# Patient Record
Sex: Male | Born: 1965 | Race: White | Hispanic: No
Health system: Midwestern US, Academic
[De-identification: ages and names within clinical notes are randomized; demographics above are authoritative.]

---

## 2020-08-18 ENCOUNTER — Encounter: Admit: 2020-08-18 | Discharge: 2020-08-18 | Payer: Commercial Managed Care - PPO

## 2020-08-19 ENCOUNTER — Encounter: Admit: 2020-08-19 | Discharge: 2020-08-19 | Payer: Commercial Managed Care - PPO

## 2020-08-25 ENCOUNTER — Encounter: Admit: 2020-08-25 | Discharge: 2020-08-25 | Payer: Commercial Managed Care - PPO

## 2020-10-06 ENCOUNTER — Encounter: Admit: 2020-10-06 | Discharge: 2020-10-06 | Payer: Commercial Managed Care - PPO

## 2020-10-09 ENCOUNTER — Encounter: Admit: 2020-10-09 | Discharge: 2020-10-09 | Payer: Commercial Managed Care - PPO

## 2020-10-09 DIAGNOSIS — R918 Other nonspecific abnormal finding of lung field: Secondary | ICD-10-CM

## 2020-10-10 NOTE — Telephone Encounter
Patient will need to be scheduled to see Gen Pulm in 2-4 weeks with repeat CT Chest w/o contrast. Order entered. Email sent to Marguarite Arbour with scheduling request.  Einar Crow, RN     Referred:  10/29 self referral  Reason:  Lung nodules  Imaging:  CT Chest 03/14/20  Pulmonologist:  Riki Sheer. MD

## 2020-10-20 ENCOUNTER — Encounter: Admit: 2020-10-20 | Discharge: 2020-10-20 | Payer: Commercial Managed Care - PPO

## 2021-03-10 ENCOUNTER — Encounter: Admit: 2021-03-10 | Discharge: 2021-03-10 | Payer: Private Health Insurance - Indemnity

## 2021-03-10 NOTE — Telephone Encounter
Pt is requesting to schedule an appt with Dr. Hardie Lora. LVM [id number]

## 2022-07-09 ENCOUNTER — Encounter: Admit: 2022-07-09 | Discharge: 2022-07-09 | Payer: Private Health Insurance - Indemnity

## 2022-12-26 IMAGING — CR [ID]
3 series · 3 of 3 positions shown · non-contrast
Comparison: none

[foot ap]
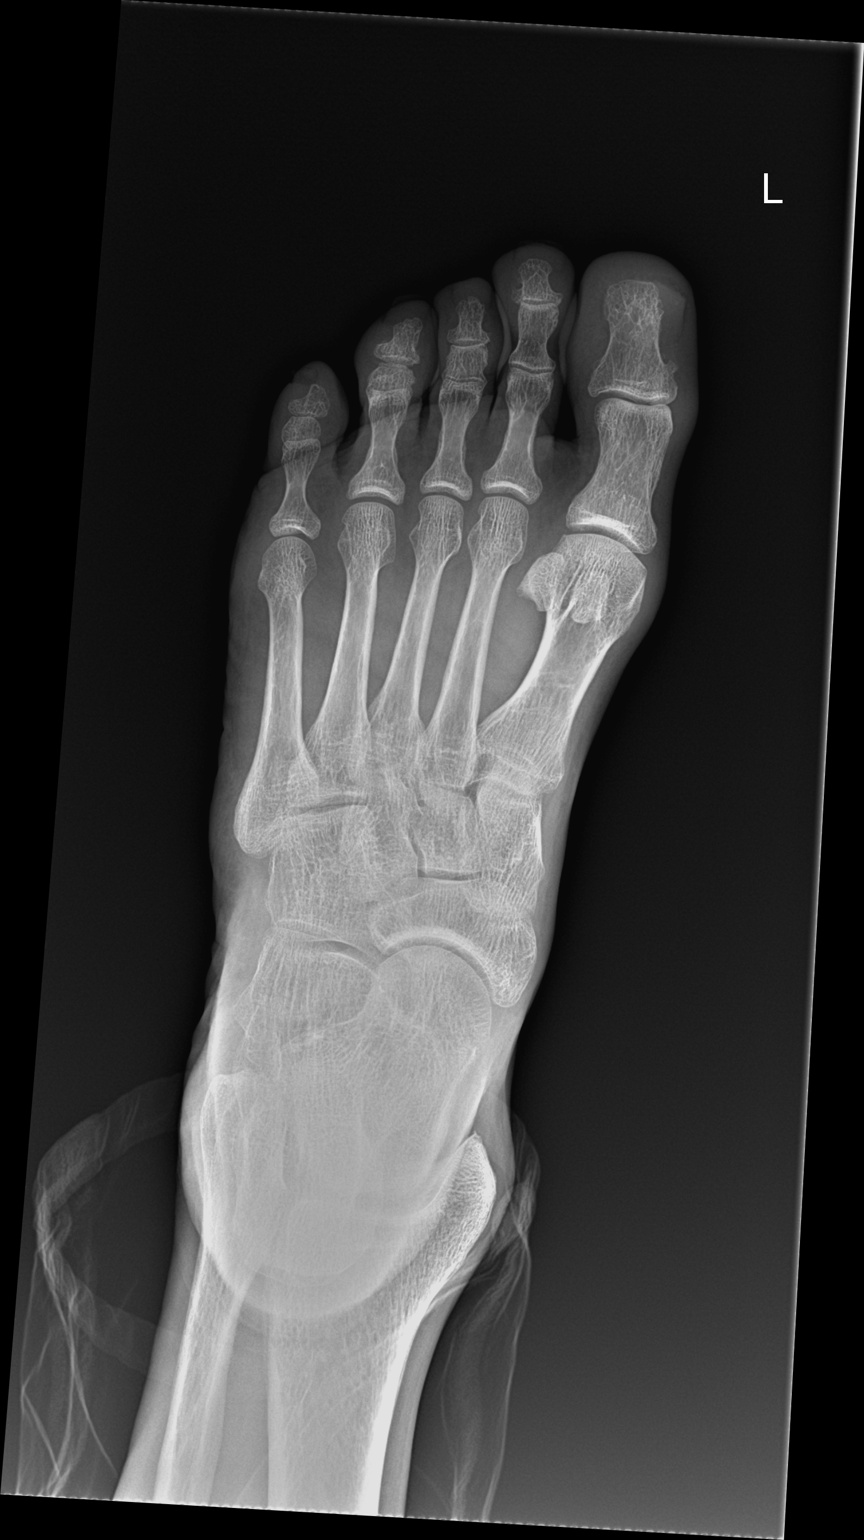

[foot obl]
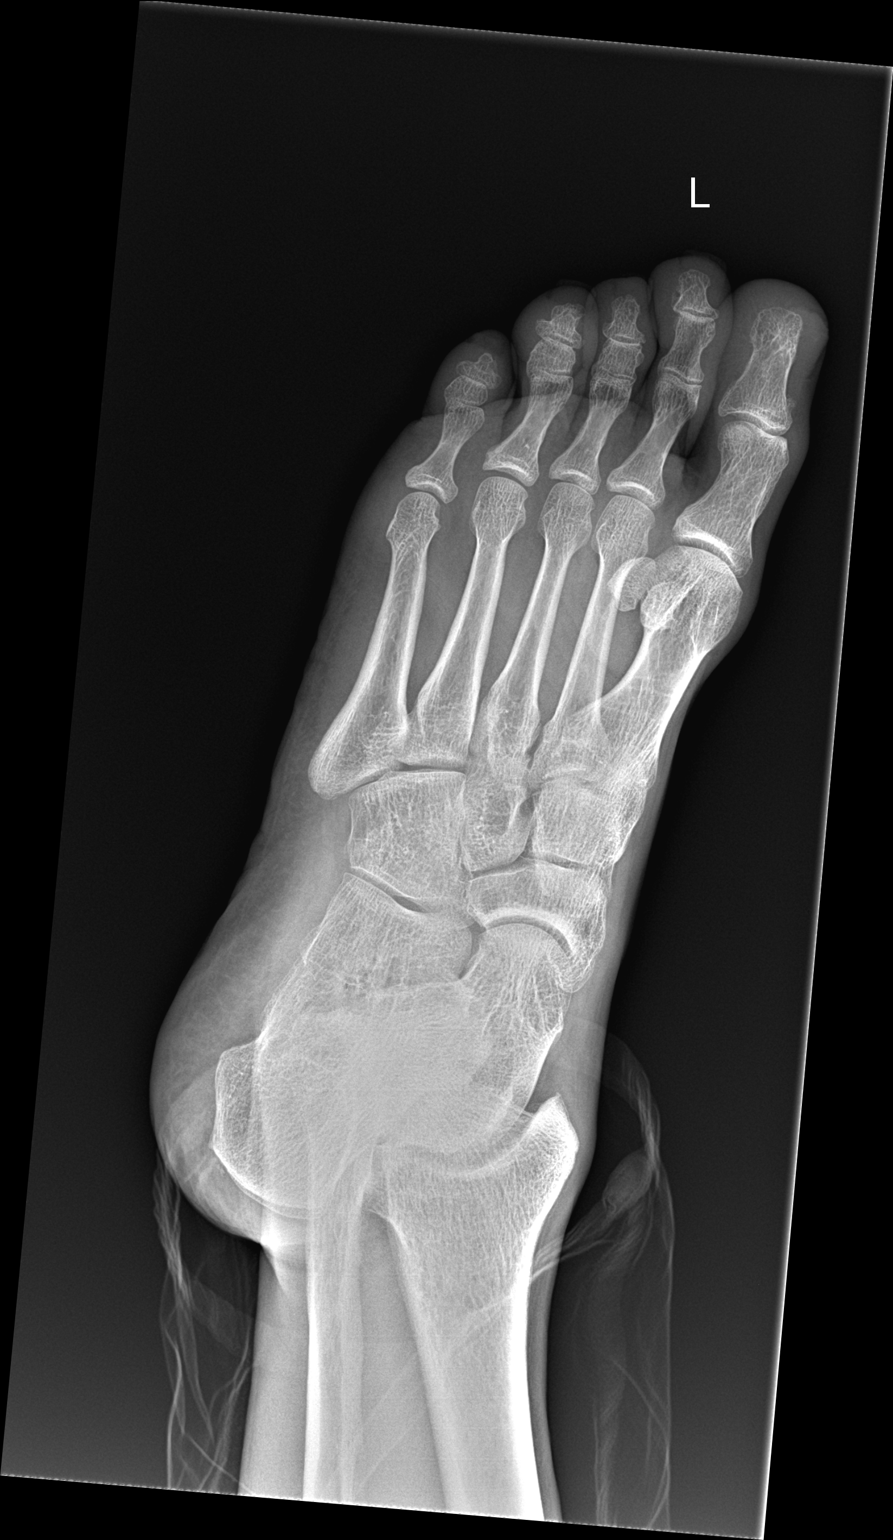

[foot lat]
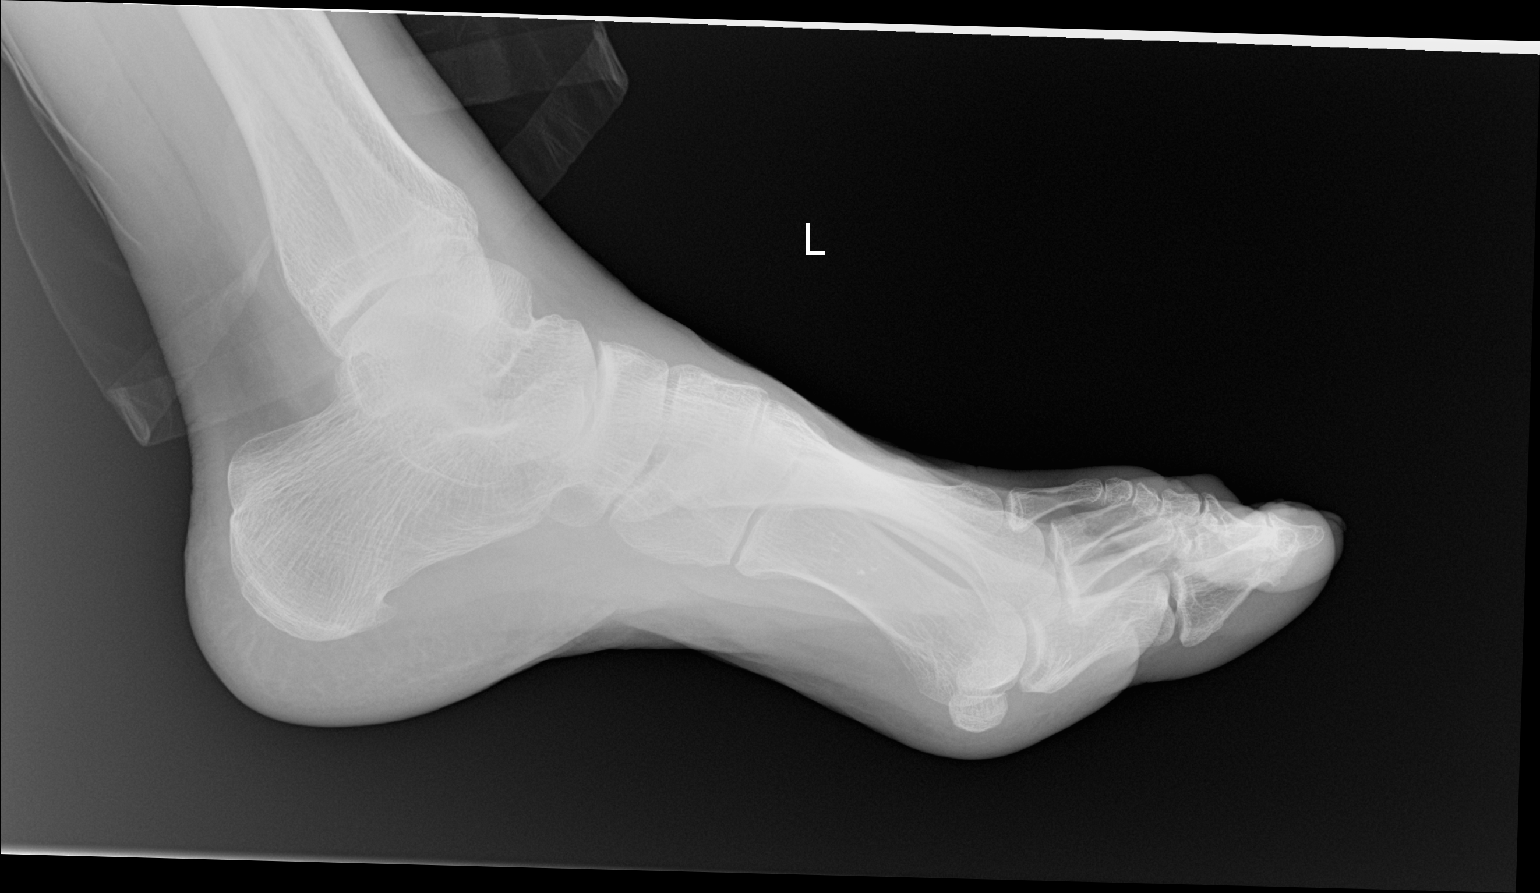

[3 of 3 positions shown; findings below may reference images not displayed]

EXAM

XR foot LT min 3V

INDICATION

left foot foreign body
stepped on glass 2 months ago, pain still on lateral side. pt states doesnt know if he got all the
glass out

TECHNIQUE

Three views of the left foot

COMPARISONS

None available at the time of dictation.

FINDINGS

No radiographic evidence of an acute fracture, osseous malalignment, or aggressive focal osseous
lesion. There is anatomic joint alignment. Trace Achilles insertional enthesophyte.

IMPRESSION
1. No radiographic evidence of an acute osseous abnormality or significant degenerative change.

Tech Notes:

stepped on glass 2 months ago, pain still on lateral side. pt states doesnt know if he got all the
glass [DATE]

## 2023-06-04 ENCOUNTER — Encounter: Admit: 2023-06-04 | Discharge: 2023-06-04 | Payer: No Typology Code available for payment source

## 2023-06-10 ENCOUNTER — Encounter: Admit: 2023-06-10 | Discharge: 2023-06-10 | Payer: No Typology Code available for payment source

## 2023-06-11 NOTE — Patient Instructions
Routine Clinic Information:    Make Checking in faster by using MyChart ahead of time by using the pre check in and complete the questionnaires. This will make your next visit Check in faster.      Please don't hesitate to call if you have any problems or questions.   My nurse, Maquita Sandoval, RN, can be reached at [phone].  If she does not answer, please leave a voicemail as she is probably rooming other patients. Please leave your name, the spelling of your last name, date of birth, phone number they can call you back and a description of why you are calling. You may also message us in MyChart.    Our fax number is [phone].     Medication refills:  Please use the MyChart Refill request or contact your pharmacy directly to request medication refills.  Please allow at least 3 business days for refill requests  .  Lab work:  The main lab is on the 1st floor of [facility].  [address], [address]  [place], [place] [address]  LAB HOURS  Mon 7 a.m. - 6 p.m.  Tues 7 a.m. - 6 p.m.  Wed 7 a.m. - 6 p.m.  Thur 7 a.m. - 6 p.m.  Fri 7 a.m. - 6 p.m.  Sat 7 a.m. - noon  Sun Closed -  It is walk in only, they do not take appointments:  Other lab locations are available; please see [email or web address]    Test results:  You will receive your test results at your appointment, by MyChart (our patient portal), or via phone or letter.   We prefer to use MyChart as much as possible.  If you are expecting results and have not heard from my office within 2 weeks of your testing, please send a MyChart message or call my office.    As a part of the CARES act, starting 03/10/2020, some results will be released to mychart automatically.  With these changes you may see your results before I do.  Critical lab results will be addressed immediately, but otherwise please  give me 72 hours to view and respond to your results before reaching out with questions. Radiology is on the 2nd floor of the [facility], among several other locations.  Please contact the radiology department to schedule at [phone].    Scheduling is available directly through MyChart, or via phone at [phone].  Same day and urgent care appointments are available.    We offer same day appointments for your acute health concerns. These appointments are on a first come, first serve basis. Please call [phone] if you would like to make an appointment.   Appointment reminders may be received over the phone and by text.  Communication preferences can be managed in MyChart to ensure you receive important appointment notifications.    Support for many chronic illnesses is available through [facility]: [facility] or 913-574-0900.    We offer Integrated Behavioral Health services, nutrition support, and pharmacist support services free of charge.  To schedule an appointment with a specialist and/or testing please call the central scheduling number at [phone].     You may see my nurse practitioner, Katelyn Wulff, for urgent needs or if I am unavailable.  We are working as a team to provide better continuity and access to our patients.      If you ever have emergency symptoms of chest pain, shortness of breath or uncontrolled or unexplained pain, please go to   your closest emergency room.  You can give us an update after you have addressed any emergency.        For urgent issues after business hours/weekends/holidays call [phone] and request for the outpatient internal medicine physician to be paged.

## 2023-06-12 ENCOUNTER — Encounter: Admit: 2023-06-12 | Discharge: 2023-06-12 | Payer: No Typology Code available for payment source

## 2023-06-12 ENCOUNTER — Ambulatory Visit: Admit: 2023-06-12 | Discharge: 2023-06-13 | Payer: No Typology Code available for payment source

## 2023-06-12 DIAGNOSIS — Z122 Encounter for screening for malignant neoplasm of respiratory organs: Secondary | ICD-10-CM

## 2023-06-12 DIAGNOSIS — Z23 Encounter for immunization: Secondary | ICD-10-CM

## 2023-06-12 DIAGNOSIS — Z7689 Persons encountering health services in other specified circumstances: Secondary | ICD-10-CM

## 2023-06-12 DIAGNOSIS — K4021 Bilateral inguinal hernia, without obstruction or gangrene, recurrent: Secondary | ICD-10-CM

## 2023-06-12 DIAGNOSIS — R918 Other nonspecific abnormal finding of lung field: Secondary | ICD-10-CM

## 2023-06-12 DIAGNOSIS — F419 Anxiety disorder, unspecified: Secondary | ICD-10-CM

## 2023-06-12 DIAGNOSIS — Z1159 Encounter for screening for other viral diseases: Secondary | ICD-10-CM

## 2023-06-12 DIAGNOSIS — Z720 Tobacco use: Secondary | ICD-10-CM

## 2023-06-12 DIAGNOSIS — I10 Essential (primary) hypertension: Secondary | ICD-10-CM

## 2023-06-12 DIAGNOSIS — T7840XA Allergy, unspecified, initial encounter: Secondary | ICD-10-CM

## 2023-06-12 DIAGNOSIS — Z1211 Encounter for screening for malignant neoplasm of colon: Secondary | ICD-10-CM

## 2023-06-12 MED ORDER — METOPROLOL TARTRATE 25 MG PO TAB
25 mg | ORAL_TABLET | Freq: Two times a day (BID) | ORAL | 1 refills | 90.00000 days | Status: AC
Start: 2023-06-12 — End: ?
  Filled 2023-06-12: qty 60, 30d supply, fill #1
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
## 2023-06-12 NOTE — Assessment & Plan Note
Elevated blood pressure noted during visit.  -Started Metoprolol 25 mg to be taken twice daily.  -Monitor blood pressure at follow-up.

## 2023-06-12 NOTE — Assessment & Plan Note
Stable for the past 3-4 years. Discussed the low likelihood of malignancy given stability over time.  -Ordered lung imaging to monitor nodules.

## 2023-06-12 NOTE — Assessment & Plan Note
Reports occasional marijuana use for anxiety management. Denies need for additional medication at this time.  -Monitor and reassess need for pharmacotherapy at follow-up.

## 2023-06-12 NOTE — Assessment & Plan Note
Reports bilateral inguinal hernias with ability to reduce. No signs of incarceration or strangulation.  -Ordered abdominal ultrasound to assess hernia severity.  -Referral to surgery for evaluation and potential repair placed.

## 2023-06-12 NOTE — Assessment & Plan Note
Current smoker with a pack lasting 3-4 days. Discussed the risks associated with smoking, including increased risk for lung cancer.  -Encourage smoking cessation.  -Consider nicotine replacement therapy or other smoking cessation aids.

## 2023-06-12 NOTE — Progress Notes
Answers submitted by the patient for this visit:  Other Symptom Questionnaire (Submitted on 06/12/2023)  Chief Complaint: Patient Reported Other  What topic(s) would you like to cover during your appointment?: lungs  Please describe the issue(s) and history with the issue (location, severity, duration, symptoms, etc.). : i have put this off to many years  What has been done so far to take care of the issue(s)?: well ihave been to a few doctors and a wast of time  What are your goals for this visit?: to take care of a few problems

## 2023-06-13 DIAGNOSIS — Z114 Encounter for screening for human immunodeficiency virus [HIV]: Secondary | ICD-10-CM

## 2023-06-13 DIAGNOSIS — Z125 Encounter for screening for malignant neoplasm of prostate: Secondary | ICD-10-CM

## 2023-06-13 DIAGNOSIS — Z0000 Encounter for general adult medical examination without abnormal findings: Secondary | ICD-10-CM

## 2023-06-13 DIAGNOSIS — Z13220 Encounter for screening for lipoid disorders: Secondary | ICD-10-CM

## 2023-06-13 DIAGNOSIS — Z131 Encounter for screening for diabetes mellitus: Secondary | ICD-10-CM

## 2023-06-17 ENCOUNTER — Encounter: Admit: 2023-06-17 | Discharge: 2023-06-17 | Payer: No Typology Code available for payment source

## 2023-06-22 ENCOUNTER — Encounter: Admit: 2023-06-22 | Discharge: 2023-06-22 | Payer: No Typology Code available for payment source

## 2023-06-26 ENCOUNTER — Encounter: Admit: 2023-06-26 | Discharge: 2023-06-26 | Payer: No Typology Code available for payment source

## 2023-06-27 ENCOUNTER — Encounter: Admit: 2023-06-27 | Discharge: 2023-06-27 | Payer: No Typology Code available for payment source

## 2023-06-27 ENCOUNTER — Ambulatory Visit: Admit: 2023-06-27 | Discharge: 2023-06-27 | Payer: No Typology Code available for payment source

## 2023-06-27 DIAGNOSIS — Z1211 Encounter for screening for malignant neoplasm of colon: Secondary | ICD-10-CM

## 2023-07-09 ENCOUNTER — Encounter: Admit: 2023-07-09 | Discharge: 2023-07-09 | Payer: No Typology Code available for payment source

## 2023-07-09 DIAGNOSIS — F419 Anxiety disorder, unspecified: Secondary | ICD-10-CM

## 2023-07-09 DIAGNOSIS — T7840XA Allergy, unspecified, initial encounter: Secondary | ICD-10-CM

## 2023-07-11 ENCOUNTER — Encounter: Admit: 2023-07-11 | Discharge: 2023-07-11 | Payer: No Typology Code available for payment source

## 2023-07-11 ENCOUNTER — Ambulatory Visit: Admit: 2023-07-11 | Discharge: 2023-07-11 | Payer: No Typology Code available for payment source

## 2023-07-17 ENCOUNTER — Encounter: Admit: 2023-07-17 | Discharge: 2023-07-17 | Payer: No Typology Code available for payment source

## 2023-07-18 ENCOUNTER — Encounter: Admit: 2023-07-18 | Discharge: 2023-07-18 | Payer: No Typology Code available for payment source

## 2023-07-22 ENCOUNTER — Encounter: Admit: 2023-07-22 | Discharge: 2023-07-22 | Payer: No Typology Code available for payment source

## 2024-02-26 ENCOUNTER — Encounter: Admit: 2024-02-26 | Discharge: 2024-02-26 | Payer: PRIVATE HEALTH INSURANCE

## 2024-04-16 ENCOUNTER — Encounter: Admit: 2024-04-16 | Discharge: 2024-04-16 | Payer: PRIVATE HEALTH INSURANCE

## 2024-07-29 ENCOUNTER — Encounter: Admit: 2024-07-29 | Discharge: 2024-07-29 | Payer: PRIVATE HEALTH INSURANCE

## 2025-01-04 ENCOUNTER — Encounter: Admit: 2025-01-04 | Discharge: 2025-01-04 | Payer: PRIVATE HEALTH INSURANCE
